# Patient Record
Sex: Male | Born: 1955 | Race: Black or African American | Hispanic: No | Marital: Married | Smoking: Current some day smoker
Health system: Southern US, Community
[De-identification: ages and names within clinical notes are randomized; demographics above are authoritative.]

## PROBLEM LIST (undated history)

## (undated) DIAGNOSIS — I10 Essential (primary) hypertension: Secondary | ICD-10-CM

## (undated) DIAGNOSIS — E119 Type 2 diabetes mellitus without complications: Secondary | ICD-10-CM

## (undated) HISTORY — PX: ILIAC ARTERY STENT: SHX1786

## (undated) HISTORY — PX: CHOLECYSTECTOMY: SHX55

## (undated) HISTORY — PX: CLOSED REDUCTION CONGENTIAL HIP DISLOCATION: SHX1357

## (undated) HISTORY — PX: CARDIAC CATHETERIZATION: SHX172

---

## 2010-12-09 DIAGNOSIS — I219 Acute myocardial infarction, unspecified: Secondary | ICD-10-CM

## 2010-12-09 HISTORY — DX: Acute myocardial infarction, unspecified: I21.9

## 2010-12-09 HISTORY — PX: CORONARY ARTERY BYPASS GRAFT: SHX141

## 2011-12-10 HISTORY — PX: CARDIAC CATHETERIZATION: SHX172

## 2020-04-05 ENCOUNTER — Other Ambulatory Visit: Payer: Self-pay | Admitting: Podiatry

## 2020-04-14 ENCOUNTER — Encounter (HOSPITAL_COMMUNITY): Payer: Self-pay

## 2020-04-17 ENCOUNTER — Other Ambulatory Visit (HOSPITAL_COMMUNITY)
Admission: RE | Admit: 2020-04-17 | Discharge: 2020-04-17 | Disposition: A | Discharge status: Home / Self Care | Payer: Medicare Other | Source: Ambulatory Visit | Attending: Podiatry | Admitting: Podiatry

## 2020-04-17 ENCOUNTER — Encounter (HOSPITAL_COMMUNITY)
Admission: RE | Admit: 2020-04-17 | Discharge: 2020-04-17 | Disposition: A | Discharge status: Home / Self Care | Payer: Medicare Other | Source: Ambulatory Visit | Attending: Podiatry | Admitting: Podiatry

## 2020-04-17 ENCOUNTER — Encounter (HOSPITAL_COMMUNITY): Payer: Self-pay

## 2020-04-17 ENCOUNTER — Other Ambulatory Visit: Payer: Self-pay

## 2020-04-17 ENCOUNTER — Ambulatory Visit (HOSPITAL_COMMUNITY)
Admission: RE | Admit: 2020-04-17 | Discharge: 2020-04-17 | Disposition: A | Discharge status: Home / Self Care | Payer: Medicare Other | Source: Ambulatory Visit | Attending: Podiatry | Admitting: Podiatry

## 2020-04-17 DIAGNOSIS — Z20822 Contact with and (suspected) exposure to covid-19: Secondary | ICD-10-CM | POA: Insufficient documentation

## 2020-04-17 DIAGNOSIS — Z01818 Encounter for other preprocedural examination: Secondary | ICD-10-CM | POA: Diagnosis present

## 2020-04-17 DIAGNOSIS — L97512 Non-pressure chronic ulcer of other part of right foot with fat layer exposed: Secondary | ICD-10-CM | POA: Insufficient documentation

## 2020-04-17 DIAGNOSIS — M19071 Primary osteoarthritis, right ankle and foot: Secondary | ICD-10-CM | POA: Diagnosis not present

## 2020-04-17 HISTORY — DX: Essential (primary) hypertension: I10

## 2020-04-17 HISTORY — DX: Type 2 diabetes mellitus without complications: E11.9

## 2020-04-17 LAB — HEMOGLOBIN A1C
Hgb A1c MFr Bld: 9.9 % — ABNORMAL HIGH (ref 4.8–5.6)
Mean Plasma Glucose: 237.43 mg/dL

## 2020-04-17 LAB — GLUCOSE, CAPILLARY: Glucose-Capillary: 254 mg/dL — ABNORMAL HIGH (ref 70–99)

## 2020-04-17 NOTE — Patient Instructions (Signed)
Your procedure is scheduled on: 04/19/2020  Report to [facility] at 7:30    AM.  Call this number if you have problems the morning of surgery: [phone]   Remember:   Do not Eat or Drink after midnight         No Smoking the morning of surgery  :  Take these medicines the morning of surgery with A SIP OF WATER: Amlodipine, Coreg, imdur, metoprolol, lyrica .   Use inhalers if needed               Take only 1/2 dose of levemir 12 units night before surgery                No diabetic medication am of surgery   Do not wear jewelry, make-up or nail polish.  Do not wear lotions, powders, or perfumes. You may wear deodorant.  Do not shave 48 hours prior to surgery. Men may shave face and neck.  Do not bring valuables to the hospital.  Contacts, dentures or bridgework may not be worn into surgery.  Leave suitcase in the car. After surgery it may be brought to your room.  For patients admitted to the hospital, checkout time is 11:00 AM the day of discharge.   Patients discharged the day of surgery will not be allowed to drive home.    Special Instructions: Shower using CHG night before surgery and shower the day of surgery use CHG.  Use special wash - you have one bottle of CHG for all showers.  You should use approximately 1/2 of the bottle for each shower.  Wound Care, Adult Taking care of your wound properly can help to prevent pain, infection, and scarring. It can also help your wound to heal more quickly. How to care for your wound Wound care      Follow instructions from your health care provider about how to take care of your wound. Make sure you: ? Wash your hands with soap and water before you change the bandage (dressing). If soap and water are not available, use hand sanitizer. ? Change your dressing as told by your health care provider. ? Leave stitches (sutures), skin glue, or adhesive strips in place. These skin closures may need to stay in place for 2 weeks or  longer. If adhesive strip edges start to loosen and curl up, you may trim the loose edges. Do not remove adhesive strips completely unless your health care provider tells you to do that.  Check your wound area every day for signs of infection. Check for: ? Redness, swelling, or pain. ? Fluid or blood. ? Warmth. ? Pus or a bad smell.  Ask your health care provider if you should clean the wound with mild soap and water. Doing this may include: ? Using a clean towel to pat the wound dry after cleaning it. Do not rub or scrub the wound. ? Applying a cream or ointment. Do this only as told by your health care provider. ? Covering the incision with a clean dressing.  Ask your health care provider when you can leave the wound uncovered.  Keep the dressing dry until your health care provider says it can be removed. Do not take baths, swim, use a hot tub, or do anything that would put the wound underwater until your health care provider approves. Ask your health care provider if you can take showers. You may only be allowed to take sponge baths. Medicines   If you  were prescribed an antibiotic medicine, cream, or ointment, take or use the antibiotic as told by your health care provider. Do not stop taking or using the antibiotic even if your condition improves.  Take over-the-counter and prescription medicines only as told by your health care provider. If you were prescribed pain medicine, take it 30 or more minutes before you do any wound care or as told by your health care provider. General instructions  Return to your normal activities as told by your health care provider. Ask your health care provider what activities are safe.  Do not scratch or pick at the wound.  Do not use any products that contain nicotine or tobacco, such as cigarettes and e-cigarettes. These may delay wound healing. If you need help quitting, ask your health care provider.  Keep all follow-up visits as told by your  health care provider. This is important.  Eat a diet that includes protein, vitamin A, vitamin C, and other nutrient-rich foods to help the wound heal. ? Foods rich in protein include meat, dairy, beans, nuts, and other sources. ? Foods rich in vitamin A include carrots and dark green, leafy vegetables. ? Foods rich in vitamin C include citrus, tomatoes, and other fruits and vegetables. ? Nutrient-rich foods have protein, carbohydrates, fat, vitamins, or minerals. Eat a variety of healthy foods including vegetables, fruits, and whole grains. Contact a health care provider if:  You received a tetanus shot and you have swelling, severe pain, redness, or bleeding at the injection site.  Your pain is not controlled with medicine.  You have redness, swelling, or pain around the wound.  You have fluid or blood coming from the wound.  Your wound feels warm to the touch.  You have pus or a bad smell coming from the wound.  You have a fever or chills.  You are nauseous or you vomit.  You are dizzy. Get help right away if:  You have a red streak going away from your wound.  The edges of the wound open up and separate.  Your wound is bleeding, and the bleeding does not stop with gentle pressure.  You have a rash.  You faint.  You have trouble breathing. Summary  Always wash your hands with soap and water before changing your bandage (dressing).  To help with healing, eat foods that are rich in protein, vitamin A, vitamin C, and other nutrients.  Check your wound every day for signs of infection. Contact your health care provider if you suspect that your wound is infected. This information is not intended to replace advice given to you by your health care provider. Make sure you discuss any questions you have with your health care provider. Document Revised: 03/15/2019 Document Reviewed: 06/11/2016 Elsevier Patient Education  2020 [employer]  Monitored Anesthesia Care, Care  After These instructions provide you with information about caring for yourself after your procedure. Your health care provider may also give you more specific instructions. Your treatment has been planned according to current medical practices, but problems sometimes occur. Call your health care provider if you have any problems or questions after your procedure. What can I expect after the procedure? After your procedure, you may:  Feel sleepy for several hours.  Feel clumsy and have poor balance for several hours.  Feel forgetful about what happened after the procedure.  Have poor judgment for several hours.  Feel nauseous or vomit.  Have a sore throat if you had a breathing tube during the  procedure. Follow these instructions at home: For at least 24 hours after the procedure:      Have a responsible adult stay with you. It is important to have someone help care for you until you are awake and alert.  Rest as needed.  Do not: ? Participate in activities in which you could fall or become injured. ? Drive. ? Use heavy machinery. ? Drink alcohol. ? Take sleeping pills or medicines that cause drowsiness. ? Make important decisions or sign legal documents. ? Take care of children on your own. Eating and drinking  Follow the diet that is recommended by your health care provider.  If you vomit, drink water, juice, or soup when you can drink without vomiting.  Make sure you have little or no nausea before eating solid foods. General instructions  Take over-the-counter and prescription medicines only as told by your health care provider.  If you have sleep apnea, surgery and certain medicines can increase your risk for breathing problems. Follow instructions from your health care provider about wearing your sleep device: ? Anytime you are sleeping, including during daytime naps. ? While taking prescription pain medicines, sleeping medicines, or medicines that make you  drowsy.  If you smoke, do not smoke without supervision.  Keep all follow-up visits as told by your health care provider. This is important. Contact a health care provider if:  You keep feeling nauseous or you keep vomiting.  You feel light-headed.  You develop a rash.  You have a fever. Get help right away if:  You have trouble breathing. Summary  For several hours after your procedure, you may feel sleepy and have poor judgment.  Have a responsible adult stay with you for at least 24 hours or until you are awake and alert. This information is not intended to replace advice given to you by your health care provider. Make sure you discuss any questions you have with your health care provider. Document Revised: 02/23/2018 Document Reviewed: 03/17/2016 Elsevier Patient Education  2020 [employer].

## 2020-04-18 LAB — SARS CORONAVIRUS 2 (TAT 6-24 HRS): SARS Coronavirus 2: NEGATIVE

## 2020-04-19 ENCOUNTER — Encounter (HOSPITAL_COMMUNITY)
Admission: RE | Disposition: A | Discharge status: Home / Self Care | Payer: Self-pay | Source: Home / Self Care | Attending: Podiatry

## 2020-04-19 ENCOUNTER — Ambulatory Visit (HOSPITAL_COMMUNITY): Payer: Medicare Other | Admitting: Anesthesiology

## 2020-04-19 ENCOUNTER — Ambulatory Visit (HOSPITAL_COMMUNITY)
Admission: RE | Admit: 2020-04-19 | Discharge: 2020-04-19 | Disposition: A | Discharge status: Home / Self Care | Payer: Medicare Other | Attending: Podiatry | Admitting: Podiatry

## 2020-04-19 ENCOUNTER — Ambulatory Visit (HOSPITAL_COMMUNITY): Payer: Medicare Other

## 2020-04-19 DIAGNOSIS — Z951 Presence of aortocoronary bypass graft: Secondary | ICD-10-CM | POA: Insufficient documentation

## 2020-04-19 DIAGNOSIS — Z79899 Other long term (current) drug therapy: Secondary | ICD-10-CM | POA: Diagnosis not present

## 2020-04-19 DIAGNOSIS — E1142 Type 2 diabetes mellitus with diabetic polyneuropathy: Secondary | ICD-10-CM | POA: Diagnosis not present

## 2020-04-19 DIAGNOSIS — L97512 Non-pressure chronic ulcer of other part of right foot with fat layer exposed: Secondary | ICD-10-CM | POA: Insufficient documentation

## 2020-04-19 DIAGNOSIS — E11621 Type 2 diabetes mellitus with foot ulcer: Secondary | ICD-10-CM | POA: Insufficient documentation

## 2020-04-19 DIAGNOSIS — Z7902 Long term (current) use of antithrombotics/antiplatelets: Secondary | ICD-10-CM | POA: Insufficient documentation

## 2020-04-19 DIAGNOSIS — E1151 Type 2 diabetes mellitus with diabetic peripheral angiopathy without gangrene: Secondary | ICD-10-CM | POA: Insufficient documentation

## 2020-04-19 DIAGNOSIS — Z7982 Long term (current) use of aspirin: Secondary | ICD-10-CM | POA: Insufficient documentation

## 2020-04-19 DIAGNOSIS — Z87891 Personal history of nicotine dependence: Secondary | ICD-10-CM | POA: Diagnosis not present

## 2020-04-19 DIAGNOSIS — Z794 Long term (current) use of insulin: Secondary | ICD-10-CM | POA: Insufficient documentation

## 2020-04-19 DIAGNOSIS — E1165 Type 2 diabetes mellitus with hyperglycemia: Secondary | ICD-10-CM | POA: Diagnosis not present

## 2020-04-19 DIAGNOSIS — M2011 Hallux valgus (acquired), right foot: Secondary | ICD-10-CM | POA: Insufficient documentation

## 2020-04-19 DIAGNOSIS — M89371 Hypertrophy of bone, right ankle and foot: Secondary | ICD-10-CM | POA: Diagnosis not present

## 2020-04-19 DIAGNOSIS — I10 Essential (primary) hypertension: Secondary | ICD-10-CM | POA: Diagnosis not present

## 2020-04-19 DIAGNOSIS — Z95820 Peripheral vascular angioplasty status with implants and grafts: Secondary | ICD-10-CM | POA: Diagnosis not present

## 2020-04-19 DIAGNOSIS — Z955 Presence of coronary angioplasty implant and graft: Secondary | ICD-10-CM | POA: Diagnosis not present

## 2020-04-19 DIAGNOSIS — Z9889 Other specified postprocedural states: Secondary | ICD-10-CM

## 2020-04-19 HISTORY — PX: TIBIALIS TENDON TRANSFER / REPAIR: SHX6630

## 2020-04-19 HISTORY — PX: ACHILLES TENDON SURGERY: SHX542

## 2020-04-19 LAB — GLUCOSE, CAPILLARY: Glucose-Capillary: 162 mg/dL — ABNORMAL HIGH (ref 70–99)

## 2020-04-19 SURGERY — TRANSFER, TENDON, TIBIA
Anesthesia: General | Site: Foot | Laterality: Right

## 2020-04-19 MED ORDER — FENTANYL CITRATE (PF) 100 MCG/2ML IJ SOLN
INTRAMUSCULAR | Status: DC | PRN
  Administered 2020-04-19: 50 ug via INTRAVENOUS
  Administered 2020-04-19 (×2): 25 ug via INTRAVENOUS

## 2020-04-19 MED ORDER — GLYCOPYRROLATE 0.2 MG/ML IJ SOLN
INTRAMUSCULAR | Status: DC | PRN
  Administered 2020-04-19: .2 mg via INTRAVENOUS

## 2020-04-19 MED ORDER — MEPERIDINE HCL 50 MG/ML IJ SOLN: 6.2500 mg | INTRAMUSCULAR | Status: DC | PRN

## 2020-04-19 MED ORDER — BUPIVACAINE HCL (PF) 0.5 % IJ SOLN
INTRAMUSCULAR | Status: DC | PRN
  Administered 2020-04-19: 20 mL

## 2020-04-19 MED ORDER — LACTATED RINGERS IV SOLN: Freq: Once | INTRAVENOUS | Status: AC

## 2020-04-19 MED ORDER — LIDOCAINE HCL (CARDIAC) PF 50 MG/5ML IV SOSY
PREFILLED_SYRINGE | INTRAVENOUS | Status: DC | PRN
  Administered 2020-04-19: 60 mg via INTRAVENOUS

## 2020-04-19 MED ORDER — PROMETHAZINE HCL 25 MG/ML IJ SOLN: 6.2500 mg | INTRAMUSCULAR | Status: DC | PRN

## 2020-04-19 MED ORDER — FENTANYL CITRATE (PF) 100 MCG/2ML IJ SOLN
INTRAMUSCULAR | Status: AC
  Filled 2020-04-19: qty 2

## 2020-04-19 MED ORDER — ONDANSETRON HCL 4 MG/2ML IJ SOLN
INTRAMUSCULAR | Status: DC | PRN
  Administered 2020-04-19: 4 mg via INTRAVENOUS

## 2020-04-19 MED ORDER — MIDAZOLAM HCL 5 MG/5ML IJ SOLN
INTRAMUSCULAR | Status: DC | PRN
  Administered 2020-04-19: 2 mg via INTRAVENOUS

## 2020-04-19 MED ORDER — PROPOFOL 10 MG/ML IV BOLUS
INTRAVENOUS | Status: DC | PRN
  Administered 2020-04-19: 160 mg via INTRAVENOUS

## 2020-04-19 MED ORDER — LIDOCAINE 2% (20 MG/ML) 5 ML SYRINGE
INTRAMUSCULAR | Status: AC
  Filled 2020-04-19: qty 10

## 2020-04-19 MED ORDER — MIDAZOLAM HCL 2 MG/2ML IJ SOLN
INTRAMUSCULAR | Status: AC
  Filled 2020-04-19: qty 2

## 2020-04-19 MED ORDER — GLYCOPYRROLATE PF 0.2 MG/ML IJ SOSY
PREFILLED_SYRINGE | INTRAMUSCULAR | Status: AC
  Filled 2020-04-19: qty 1

## 2020-04-19 MED ORDER — CHLORHEXIDINE GLUCONATE CLOTH 2 % EX PADS: 6.0000 | MEDICATED_PAD | Freq: Once | CUTANEOUS | Status: DC

## 2020-04-19 MED ORDER — 0.9 % SODIUM CHLORIDE (POUR BTL) OPTIME
TOPICAL | Status: DC | PRN
  Administered 2020-04-19: 1000 mL

## 2020-04-19 MED ORDER — CEFAZOLIN SODIUM-DEXTROSE 2-4 GM/100ML-% IV SOLN
2.0000 g | INTRAVENOUS | Status: AC
  Administered 2020-04-19: 2 g via INTRAVENOUS
  Filled 2020-04-19: qty 100

## 2020-04-19 MED ORDER — BUPIVACAINE HCL (PF) 0.5 % IJ SOLN
INTRAMUSCULAR | Status: AC
  Filled 2020-04-19: qty 30

## 2020-04-19 MED ORDER — EPHEDRINE 5 MG/ML INJ
INTRAVENOUS | Status: AC
  Filled 2020-04-19: qty 20

## 2020-04-19 MED ORDER — EPHEDRINE SULFATE 50 MG/ML IJ SOLN
INTRAMUSCULAR | Status: DC | PRN
Start: 2020-04-19 — End: 2020-04-19
  Administered 2020-04-19 (×2): 10 mg via INTRAVENOUS

## 2020-04-19 MED ORDER — HYDROMORPHONE HCL 1 MG/ML IJ SOLN: 0.2500 mg | INTRAMUSCULAR | Status: DC | PRN

## 2020-04-19 MED ORDER — LIDOCAINE HCL (PF) 1 % IJ SOLN
INTRAMUSCULAR | Status: AC
  Filled 2020-04-19: qty 30

## 2020-04-19 MED ORDER — PROPOFOL 10 MG/ML IV BOLUS
INTRAVENOUS | Status: AC
  Filled 2020-04-19: qty 20

## 2020-04-19 SURGICAL SUPPLY — 62 items
0.062 k wire ×3 IMPLANT
BANDAGE ELASTIC 4 VELCRO NS (GAUZE/BANDAGES/DRESSINGS) ×6 IMPLANT
BANDAGE ELASTIC 6 VELCRO NS (GAUZE/BANDAGES/DRESSINGS) ×3 IMPLANT
BANDAGE ESMARK 4X12 BL STRL LF (DISPOSABLE) ×1 IMPLANT
BENZOIN TINCTURE PRP APPL 2/3 (GAUZE/BANDAGES/DRESSINGS) ×3 IMPLANT
BIT DRILL 2.0 HCS 150 (BIT) ×3 IMPLANT
BIT DRILL MICR ACTRK 2 LNG PRF (BIT) ×1 IMPLANT
BLADE AVERAGE 25MMX9MM (BLADE) ×1
BLADE AVERAGE 25X9 (BLADE) ×2 IMPLANT
BLADE SURG 15 STRL LF DISP TIS (BLADE) ×2 IMPLANT
BLADE SURG 15 STRL SS (BLADE) ×4
BLADE SURG SZ11 CARB STEEL (BLADE) ×3 IMPLANT
BNDG CONFORM 3 STRL LF (GAUZE/BANDAGES/DRESSINGS) ×3 IMPLANT
BNDG ELASTIC 4X5.8 VLCR NS LF (GAUZE/BANDAGES/DRESSINGS) ×3 IMPLANT
BNDG ESMARK 4X12 BLUE STRL LF (DISPOSABLE) ×3
BNDG GAUZE ELAST 4 BULKY (GAUZE/BANDAGES/DRESSINGS) ×3 IMPLANT
BOOT STEPPER DURA XLG (SOFTGOODS) ×3 IMPLANT
CAP PIN ORTHO PINK (CAP) ×3 IMPLANT
CHLORAPREP W/TINT 26 (MISCELLANEOUS) ×3 IMPLANT
CLOTH BEACON ORANGE TIMEOUT ST (SAFETY) ×3 IMPLANT
COVER LIGHT HANDLE STERIS (MISCELLANEOUS) ×6 IMPLANT
COVER WAND RF STERILE (DRAPES) ×3 IMPLANT
CUFF TOURN SGL QUICK 34 (TOURNIQUET CUFF) ×2
CUFF TRNQT CYL 34X4.125X (TOURNIQUET CUFF) ×1 IMPLANT
DRAPE OEC MINIVIEW 54X84 (DRAPES) ×3 IMPLANT
DRILL MICRO ACUTRAK 2 LNG PROF (BIT) ×3
DRSG ADAPTIC 3X8 NADH LF (GAUZE/BANDAGES/DRESSINGS) ×3 IMPLANT
ELECT REM PT RETURN 9FT ADLT (ELECTROSURGICAL) ×3
ELECTRODE REM PT RTRN 9FT ADLT (ELECTROSURGICAL) ×1 IMPLANT
GAUZE SPONGE 4X4 12PLY STRL (GAUZE/BANDAGES/DRESSINGS) ×3 IMPLANT
GLOVE BIO SURGEON STRL SZ7.5 (GLOVE) ×3 IMPLANT
GLOVE BIOGEL PI IND STRL 7.0 (GLOVE) ×2 IMPLANT
GLOVE BIOGEL PI IND STRL 7.5 (GLOVE) ×1 IMPLANT
GLOVE BIOGEL PI INDICATOR 7.0 (GLOVE) ×4
GLOVE BIOGEL PI INDICATOR 7.5 (GLOVE) ×2
GLOVE ECLIPSE 6.5 STRL STRAW (GLOVE) ×3 IMPLANT
GLOVE ECLIPSE 7.0 STRL STRAW (GLOVE) ×3 IMPLANT
GOWN STRL REUS W/ TWL LRG LVL3 (GOWN DISPOSABLE) ×1 IMPLANT
GOWN STRL REUS W/TWL LRG LVL3 (GOWN DISPOSABLE) ×8 IMPLANT
GUIDEWIRE ORTHO MICROSHT  ACUT (WIRE) ×4
GUIDEWIRE ORTHO MICROSHT .035 (WIRE) ×2 IMPLANT
K-WIRE DBL END TROCAR 6X.062 (WIRE) ×6
K-WIRE NON THREAD 1.1 (WIRE) ×3
KIT TURNOVER KIT A (KITS) ×3 IMPLANT
KWIRE DBL END TROCAR 6X.062 (WIRE) ×2 IMPLANT
KWIRE NON THREAD 1.1 (WIRE) ×1 IMPLANT
MANIFOLD NEPTUNE II (INSTRUMENTS) ×3 IMPLANT
NEEDLE HYPO 27GX1-1/4 (NEEDLE) ×3 IMPLANT
NS IRRIG 1000ML POUR BTL (IV SOLUTION) ×3 IMPLANT
PACK BASIC LIMB (CUSTOM PROCEDURE TRAY) ×3 IMPLANT
PAD ARMBOARD 7.5X6 YLW CONV (MISCELLANEOUS) ×3 IMPLANT
RASP SM TEAR CROSS CUT (RASP) ×3 IMPLANT
SCREW ACUTRAK 2 MICRO 18MM (Screw) ×3 IMPLANT
SCREW ACUTRAK 2 MINI 18MM (Screw) ×3 IMPLANT
SET BASIN LINEN APH (SET/KITS/TRAYS/PACK) ×3 IMPLANT
SPLINT FIBERGLASS 4X30 (CAST SUPPLIES) ×3 IMPLANT
SPONGE LAP 18X18 RF (DISPOSABLE) ×3 IMPLANT
SUT ETHILON 4 0 P 3 18 (SUTURE) ×6 IMPLANT
SUT MON AB 5-0 PS2 18 (SUTURE) ×3 IMPLANT
SUT VIC AB 4-0 PS2 27 (SUTURE) ×3 IMPLANT
SYR CONTROL 10ML LL (SYRINGE) ×6 IMPLANT
WATER STERILE IRR 1000ML POUR (IV SOLUTION) ×3 IMPLANT

## 2020-04-19 NOTE — H&P (Signed)
HISTORY AND PHYSICAL INTERVAL NOTE:  04/19/2020  8:39 AM  [facility]  has presented today for surgery, with the diagnosis of chronic ulcer of the right foot and hallux valgus of the right foot.  The various methods of treatment have been discussed with the patient.  No guarantees were given.  After consideration of risks, benefits and other options for treatment, the patient has consented to surgery.  I have reviewed the patients' chart and labs.    Patient Vitals for the past 24 hrs:  BP Temp Pulse Resp SpO2  04/19/20 0813 (!) 175/86 98.3 F (36.8 C) (!) 50 18 98 %    A history and physical examination was performed in my office.  The patient was reexamined.  There have been no changes to this history and physical examination.  Dallas Schimke, DPM

## 2020-04-19 NOTE — Brief Op Note (Addendum)
BRIEF OPERATIVE NOTE  DATE OF PROCEDURE 04/19/2020  SURGEON Dallas Schimke, DPM  ASSISTANT SURGEON Delton See, DPM  OR STAFF Circulator: Cox, Wyatt Haste, RN Scrub Person: Jonell Cluck, CST   PREOPERATIVE DIAGNOSIS 1.  Chronic ulcer, right foot 2.  Hallux valgus, right foot 3.  Diabetes mellitus with peripheral neuropathy  POSTOPERATIVE DIAGNOSIS 1.  Chronic ulcer, right foot 2.  Hypertrophied tibial sesamoid, right foot 3.  Hallux valgus, right foot 4.  Diabetes mellitus with peripheral neuropathy  PROCEDURE 1.  Tibial sesamoidectomy, right foot 2.  Chevron osteotomy of the distal first metatarsal, right foot 3.  Debridement of ulceration, right foot  4.  Percutaneous Achilles tendon lengthening, right foot 5.  Application of below knee splint, right lower extremity  ANESTHESIA General   HEMOSTASIS Pneumatic thigh tourniquet set at 300 mmHg  ESTIMATED BLOOD LOSS Minimal (<5 cc)  MATERIALS USED 0.062" K-wire  INJECTABLES 0.5% Marcaine plain  PATHOLOGY Tibial sesamoid, right foot  COMPLICATIONS None

## 2020-04-19 NOTE — Op Note (Signed)
OPERATIVE NOTE  DATE OF PROCEDURE 04/19/2020  SURGEON Marcheta Grammes, DPM  ASSISTANT SURGEON Jilda Panda, DPM  OR STAFF Circulator: Cox, Gershon Mussel, RN Scrub Person: Lucie Leather, CST   PREOPERATIVE DIAGNOSIS 1.  Chronic ulcer, right foot 2.  Hallux valgus, right foot 3.  Diabetes mellitus with peripheral neuropathy  POSTOPERATIVE DIAGNOSIS 1.  Chronic ulcer, right foot 2.  Hypertrophied tibial sesamoid, right foot 3.  Hallux valgus, right foot 4.  Diabetes mellitus with peripheral neuropathy  PROCEDURE 1.  Tibial sesamoidectomy, right foot 2.  Chevron osteotomy of the distal first metatarsal, right foot 3.  Debridement of ulceration, right foot  4.  Percutaneous Achilles tendon lengthening, right foot 5.  Application of below knee splint, right lower extremity  ANESTHESIA General   HEMOSTASIS Pneumatic thigh tourniquet set at 300 mmHg  ESTIMATED BLOOD LOSS Minimal (<5 cc)  MATERIALS USED 0.062" K-wire  INJECTABLES 0.5% Marcaine plain  PATHOLOGY Tibial sesamoid, right foot  COMPLICATIONS None  INDICATIONS: The patient has had a chronic ulceration of his right foot that failed to heal with local wound care.  A noninvasive arterial study was performed which revealed perfusion adequate for healing.  Surgery was recommended.  The planned procedures were explained.  All questions were answered.  No guarantees were given.  An informed consent was obtained.  DESCRIPTION OF THE PROCEDURE: The patient was brought to the operating room and placed on the operative table in the supine position.  After general anesthesia, a pneumatic thigh tourniquet was placed about the patient's right thigh.  The right lower extremity was scrubbed, prepped and draped in the usual sterile fashion.  A timeout was performed.  The right lower extremity was then elevated, exsanguinated and the pneumatic thigh tourniquet was inflated to 300 mmHg.  The right foot was anesthetized with  0.5% Marcaine plain.  Attention was directed to the medial aspect of the first MPJ.  A linear longitudinal incision was made medial to the first MPJ.  Dissection was continued deep down to the level of the first MPJ.  All vital structures were identified and retracted.  A linear longitudinal periosteal and capsular incision was performed.  The periosteal and capsular structures were reflected dorsally and plantarly.  The tibial sesamoid was identified and freed of all soft tissue attachments.  The tibial sesamoid was removed and sent to pathology for evaluation.  The flexor hallucis longus tendon was identified and noted to be intact.  A chevron osteotomy was performed in the distal first metatarsal using a power bone saw.  The capital fragment was distracted and shifted into a more corrected lateral position.  A cyst was encountered while performing the osteotomy.  An Acumed AccuTrack micro screw was then inserted across the osteotomy.  Adequate compression was not achieved.  The screw was removed.  A larger screw from the Acumed AccuTrack set was inserted across the osteotomy.  Adequate compression was not achieved.  The screw was removed.  At 0.062 inch Kirschner wire was inserted across the osteotomy from a proximal medial to distal lateral direction.  Adequate fixation was achieved.  The Kirschner wire was bent and cut.  The residual medial eminence was resected using a power bone saw.  The wound was irrigated with copious amounts of sterile irrigant.  The capsular structures were reapproximated using 2-0 Vicryl.  The subcutaneous structures were reapproximated using 3-0 Vicryl.  The skin was reapproximated using 4-0 Vicryl in a running subcuticular manner.  The wound closure was reinforced  with 4-0 nlon.  Attention was directed to the ulceration along the medial plantar aspect of the right foot.  The ulceration measured 0.8 x 0.4 x 0.2 cm.  The wound bed was comprised of mixed fibrotic and granular tissue.   The ulceration required debridement of the adherent, nonviable fibrotic tissue.  A sharp excisional debridement of the ulceration was performed using a #15 blade and soft tissue nipper.  The wound bed was comprised of next including subcutaneous tissue was debrided and removed.  Post debridement, the ulceration measured 1.1 x 0.6 x 0.3 cm.  Attention was directed to the posterior aspect of the right Achilles tendon.  A percutaneous lengthening of the Achilles tendon was performed.  3 hemisections were created using a #15 blade to were created medially and one laterally.  The limted dorsiflexion improved.  The wounds were irrigated with saline.  The incisions were reapproximated using 4-0 nylon.  A sterile compressive dressing was applied to the right lower extremity.  The pneumatic thigh tourniquet was deflated and a prompt hyperemic response was noted to all digits of the right foot.  A below-knee splint was applied to the left lower extremity.  The patient tolerated the procedurea and anesthesia well.  He was transferred from the operating room to the postanesthesia care unit with vital signs stable.

## 2020-04-19 NOTE — Transfer of Care (Signed)
Immediate Anesthesia Transfer of Care Note  Patient: [facility]  Procedure(s) Performed: TIBIALIS SESMOIDECTOMY;  CHEVRON OSTEOTOMY (Right Foot) PERCUTANEOUS TENDON ACHILLES LENGTHENING (Right Foot)  Patient Location: PACU  Anesthesia Type:General  Level of Consciousness: awake  Airway & Oxygen Therapy: Patient Spontanous Breathing  Post-op Assessment: Report given to RN  Post vital signs: Reviewed  Last Vitals:  Vitals Value Taken Time  BP 167/92 04/19/20 1138  Temp    Pulse 57 04/19/20 1140  Resp 16 04/19/20 1140  SpO2 92 % 04/19/20 1140  Vitals shown include unvalidated device data.  Last Pain:  Vitals:   04/19/20 0813  PainSc: 0-No pain         Complications: No apparent anesthesia complications

## 2020-04-19 NOTE — Anesthesia Procedure Notes (Signed)
Procedure Name: LMA Insertion Date/Time: 04/19/2020 9:08 AM Performed by: Moshe Salisbury, CRNA Pre-anesthesia Checklist: Patient identified, Patient being monitored, Emergency Drugs available, Timeout performed and Suction available Patient Re-evaluated:Patient Re-evaluated prior to induction Oxygen Delivery Method: Circle System Utilized Preoxygenation: Pre-oxygenation with 100% oxygen Induction Type: IV induction Ventilation: Mask ventilation without difficulty LMA: LMA inserted LMA Size: 5.0 Number of attempts: 1 Placement Confirmation: positive ETCO2 and breath sounds checked- equal and bilateral

## 2020-04-19 NOTE — Anesthesia Preprocedure Evaluation (Addendum)
Anesthesia Evaluation  Patient identified by MRN, date of birth, ID band Patient awake    Reviewed: Allergy & Precautions, NPO status , Patient's Chart, lab work & pertinent test results, reviewed documented beta blocker date and time   Airway Mallampati: II  TM Distance: >3 FB Neck ROM: Full    Dental  (+) Dental Advisory Given, Missing   Pulmonary Current Smoker and Patient abstained from smoking.,    Pulmonary exam normal breath sounds clear to auscultation       Cardiovascular Exercise Tolerance: Good hypertension, Pt. on medications and Pt. on home beta blockers + CABG and + Peripheral Vascular Disease  Normal cardiovascular exam Rhythm:Regular Rate:Normal  Stress test - negative   Neuro/Psych negative neurological ROS  negative psych ROS   GI/Hepatic negative GI ROS, Neg liver ROS,   Endo/Other  diabetes, Poorly Controlled, Type 2, Insulin Dependent, Oral Hypoglycemic Agents  Renal/GU      Musculoskeletal negative musculoskeletal ROS (+)   Abdominal   Peds  Hematology negative hematology ROS (+)   Anesthesia Other Findings   Reproductive/Obstetrics negative OB ROS                           Anesthesia Physical Anesthesia Plan  ASA: III  Anesthesia Plan: General   Post-op Pain Management:    Induction: Intravenous  PONV Risk Score and Plan: 2 and Ondansetron and Midazolam  Airway Management Planned: LMA  Additional Equipment:   Intra-op Plan:   Post-operative Plan:   Informed Consent: I have reviewed the patients History and Physical, chart, labs and discussed the procedure including the risks, benefits and alternatives for the proposed anesthesia with the patient or authorized representative who has indicated his/her understanding and acceptance.     Dental advisory given  Plan Discussed with: CRNA and Surgeon  Anesthesia Plan Comments:       Anesthesia Quick  Evaluation

## 2020-04-19 NOTE — Anesthesia Postprocedure Evaluation (Signed)
Anesthesia Post Note  Patient: [facility]  Procedure(s) Performed: TIBIALIS SESMOIDECTOMY;  CHEVRON OSTEOTOMY (Right Foot) PERCUTANEOUS TENDON ACHILLES LENGTHENING (Right Foot)  Patient location during evaluation: PACU Anesthesia Type: General Level of consciousness: awake, awake and alert and oriented Pain management: pain level controlled Vital Signs Assessment: post-procedure vital signs reviewed and stable Respiratory status: spontaneous breathing and respiratory function stable Cardiovascular status: blood pressure returned to baseline and stable Postop Assessment: no apparent nausea or vomiting Anesthetic complications: no     Last Vitals:  Vitals:   04/19/20 1200 04/19/20 1217  BP: (!) 166/89 131/84  Pulse: (!) 56   Resp: 13 16  Temp:  (!) 36.4 C  SpO2: 99% 97%    Last Pain:  Vitals:   04/19/20 1217  PainSc: 8                  Kenyette Gundy C Hermine Feria

## 2020-04-19 NOTE — Discharge Instructions (Signed)
These instructions will give you an idea of what to expect after surgery and how to manage issues that may arise before your first post op office visit.  Pain Management Pain is best managed by "staying ahead" of it. If pain gets out of control, it is difficult to get it back under control. Local anesthesia that lasts 6-8 hours is used to numb the foot and decrease pain.  For the best pain control, take the pain medication every 4 hours for the first 2 days post op. On the third day pain medication can be taken as needed.   Post Op Nausea Nausea is common after surgery, so it is managed proactively.  If prescribed, use the prescribed nausea medication regularly for the first 2 days post op.  Bandages Do not worry if there is blood on the bandage. What looks like a lot of blood on the bandage is actually a small amount. Blood on the dressing spreads out as it is absorbed by the gauze, the same way a drop of water spreads out on a paper towel.  If the bandages feel wet or dry, stiff and uncomfortable, call the office during office hours and we will schedule a time for you to have the bandage changed.  Unless you are specifically told otherwise, we will do the first bandage change in the office.  Keep your bandage dry. If the bandage becomes wet or soiled, notify the office and we will schedule a time to change the bandage.  Activity It is best to spend most of the first 2 days after surgery lying down with the foot elevated above the level of your heart. You may put partial weight on your heel while wearing the splint to transfer on and off the toilet.    Driving Do not drive until you are able to respond in an emergency (i.e. slam on the brakes). This usually occurs after the bone has healed - 6 to 8 weeks.  Call the Office If you have a fever over 101F.  If you have increasing pain after the initial post op pain has settled down.  If you have increasing redness, swelling, or drainage.  If  you have any questions or concerns.

## 2020-04-21 LAB — SURGICAL PATHOLOGY

## 2020-06-19 ENCOUNTER — Other Ambulatory Visit: Payer: Self-pay | Admitting: Podiatry

## 2020-06-19 NOTE — Patient Instructions (Signed)
Richard Stout  06/19/2020     @   Your procedure is scheduled on   06/22/2020   Report to [facility] at  0730  A.M.  Call this number if you have problems the morning of surgery:  [phone]   Remember:  Do not eat or drink after midnight.                        Take these medicines the morning of surgery with A SIP OF WATER  Amlodipine, brilinta, carvedilol, lexapro, hydrocodone(if needed), isosorbide, metoprolol, lyrica, Phenergan(if needed). Take 1/2of your usual  levemir dosage the night before your procedure. DO NOT take any medications for diabetes the morning of your procedure.    Do not wear jewelry, make-up or nail polish.  Do not wear lotions, powders, or perfumes. Please wear deodorant and brush your teeth.  Do not shave 48 hours prior to surgery.  Men may shave face and neck.  Do not bring valuables to the hospital.  [facility] is not responsible for any belongings or valuables.  Contacts, dentures or bridgework may not be worn into surgery.  Leave your suitcase in the car.  After surgery it may be brought to your room.  For patients admitted to the hospital, discharge time will be determined by your treatment team.  Patients discharged the day of surgery will not be allowed to drive home.   Name and phone number of your driver:   family Special instructions:  DO NOT smoke the morning of your procedure.  Please read over the following fact sheets that you were given. Anesthesia Post-op Instructions and Care and Recovery After Surgery       Orthopedic Hardware Removal, Care After This sheet gives you information about how to care for yourself after your procedure. Your health care provider may also give you more specific instructions. If you have problems or questions, contact your health care provider. What can I expect after the procedure? After the procedure, it is common to have:  Soreness or pain.  Some swelling in the  area where the hardware was removed.  A small amount of blood or clear fluid coming from your incision. Follow these instructions at home: If you have a cast:  Do not stick anything inside the cast to scratch your skin. Doing that increases your risk of infection.  Check the skin around the cast every day. Tell your health care provider about any concerns.  You may put lotion on dry skin around the edges of the cast. Do not put lotion on the skin underneath the cast.  Keep the cast clean and dry. If you have a splint or boot:  Wear the splint or boot as told by your health care provider. Remove it only as told by your health care provider.  Loosen the splint or boot if your fingers or toes tingle, become numb, or turn cold and blue.  Keep the splint or boot clean and dry. Bathing  Do not take baths, swim, or use a hot tub until your health care provider approves. Ask your health care provider if you may take showers. You may only be allowed to take sponge baths.  Keep the bandage (dressing) dry until your health care provider says it can be removed.  If your cast, splint, or boot is not waterproof: ? Do not let it get wet. ? Cover it with a watertight covering  when you take a bath or a shower. Incision care   Follow instructions from your health care provider about how to take care of your incision. Make sure you: ? Wash your hands with soap and water before you change your dressing. If soap and water are not available, use hand sanitizer. ? Change your dressing as told by your health care provider. ? Leave stitches (sutures), skin glue, or adhesive strips in place. These skin closures may need to stay in place for 2 weeks or longer. If adhesive strip edges start to loosen and curl up, you may trim the loose edges. Do not remove adhesive strips completely unless your health care provider tells you to do that.  Check your incision area every day for signs of infection. Check  for: ? Redness. ? More swelling or pain. ? More fluid or blood. ? Warmth. ? Pus or a bad smell. Managing pain, stiffness, and swelling   If directed, put ice on the affected area: ? If you have a removable splint or boot, remove it as told by your health care provider. ? Put ice in a plastic bag. ? Place a towel between your skin and the bag. ? Leave the ice on for 20 minutes, 2-3 times a day.  Move your fingers or toes often to avoid stiffness and to lessen swelling.  Raise (elevate) the injured area above the level of your heart while you are sitting or lying down. Driving  Do not drive or use heavy machinery while taking prescription pain medicine.  Do not drive for 24 hours if you were given a medicine to help you relax (sedative) during your procedure.  Ask your health care provider when it is safe to drive if you have a cast, splint, or boot on the affected limb. Activity  Ask your health care provider what activities are safe for you during recovery, and ask what activities you need to avoid.  Do not use the injured limb to support your body weight until your health care provider says that you can.  Do not play contact sports until your health care provider approves.  Do exercises as told by your health care provider.  Avoid sitting for a long time without moving. Get up and move around at least every few hours. This will help prevent blood clots. General instructions  Do not put pressure on any part of the cast or splint until it is fully hardened. This may take several hours.  If you are taking prescription pain medicine, take actions to prevent or treat constipation. Your health care provider may recommend that you: ? Drink enough fluid to keep your urine pale yellow. ? Eat foods that are high in fiber, such as fresh fruits and vegetables, whole grains, and beans. ? Limit foods that are high in fat and processed sugars, such as fried or sweet foods. ? Take an  over-the-counter or prescription medicine for constipation.  Do not use any products that contain nicotine or tobacco, such as cigarettes and e-cigarettes. These can delay bone healing after surgery. If you need help quitting, ask your health care provider.  Take over-the-counter and prescription medicines only as told by your health care provider.  Keep all follow-up visits as told by your health care provider. This is important. Contact a health care provider if:  You have lasting pain.  You have redness around your incision.  You have more swelling or pain around your incision.  You have more fluid or blood  coming from your incision.  Your incision feels warm to the touch.  You have pus or a bad smell coming from your incision.  You are unable to do exercises or physical activity as told by your health care provider. Get help right away if:  You have difficulty breathing.  You have chest pain.  You have severe pain.  You have a fever or chills.  You have numbness for more than 24 hours in the area where the hardware was removed. Summary  After the procedure, it is common to have some pain and swelling in the area where the hardware was removed.  Follow instructions from your health care provider about how to take care of your incision.  Return to your normal activities as told by your health care provider. Ask your health care provider what activities are safe for you. This information is not intended to replace advice given to you by your health care provider. Make sure you discuss any questions you have with your health care provider. Document Revised: 01/21/2019 Document Reviewed: 12/18/2017 Elsevier Patient Education  2020 [employer]  Monitored Anesthesia Care, Care After These instructions provide you with information about caring for yourself after your procedure. Your health care provider may also give you more specific instructions. Your treatment has been  planned according to current medical practices, but problems sometimes occur. Call your health care provider if you have any problems or questions after your procedure. What can I expect after the procedure? After your procedure, you may:  Feel sleepy for several hours.  Feel clumsy and have poor balance for several hours.  Feel forgetful about what happened after the procedure.  Have poor judgment for several hours.  Feel nauseous or vomit.  Have a sore throat if you had a breathing tube during the procedure. Follow these instructions at home: For at least 24 hours after the procedure:      Have a responsible adult stay with you. It is important to have someone help care for you until you are awake and alert.  Rest as needed.  Do not: ? Participate in activities in which you could fall or become injured. ? Drive. ? Use heavy machinery. ? Drink alcohol. ? Take sleeping pills or medicines that cause drowsiness. ? Make important decisions or sign legal documents. ? Take care of children on your own. Eating and drinking  Follow the diet that is recommended by your health care provider.  If you vomit, drink water, juice, or soup when you can drink without vomiting.  Make sure you have little or no nausea before eating solid foods. General instructions  Take over-the-counter and prescription medicines only as told by your health care provider.  If you have sleep apnea, surgery and certain medicines can increase your risk for breathing problems. Follow instructions from your health care provider about wearing your sleep device: ? Anytime you are sleeping, including during daytime naps. ? While taking prescription pain medicines, sleeping medicines, or medicines that make you drowsy.  If you smoke, do not smoke without supervision.  Keep all follow-up visits as told by your health care provider. This is important. Contact a health care provider if:  You keep feeling  nauseous or you keep vomiting.  You feel light-headed.  You develop a rash.  You have a fever. Get help right away if:  You have trouble breathing. Summary  For several hours after your procedure, you may feel sleepy and have poor judgment.  Have a  responsible adult stay with you for at least 24 hours or until you are awake and alert. This information is not intended to replace advice given to you by your health care provider. Make sure you discuss any questions you have with your health care provider. Document Revised: 02/23/2018 Document Reviewed: 03/17/2016 Elsevier Patient Education  2020 [employer]. How to Use Chlorhexidine for Bathing Chlorhexidine gluconate (CHG) is a germ-killing (antiseptic) solution that is used to clean the skin. It can get rid of the bacteria that normally live on the skin and can keep them away for about 24 hours. To clean your skin with CHG, you may be given:  A CHG solution to use in the shower or as part of a sponge bath.  A prepackaged cloth that contains CHG. Cleaning your skin with CHG may help lower the risk for infection:  While you are staying in the intensive care unit of the hospital.  If you have a vascular access, such as a central line, to provide short-term or long-term access to your veins.  If you have a catheter to drain urine from your bladder.  If you are on a ventilator. A ventilator is a machine that helps you breathe by moving air in and out of your lungs.  After surgery. What are the risks? Risks of using CHG include:  A skin reaction.  Hearing loss, if CHG gets in your ears.  Eye injury, if CHG gets in your eyes and is not rinsed out.  The CHG product catching fire. Make sure that you avoid smoking and flames after applying CHG to your skin. Do not use CHG:  If you have a chlorhexidine allergy or have previously reacted to chlorhexidine.  On babies younger than 8 months of age. How to use CHG solution  Use  CHG only as told by your health care provider, and follow the instructions on the label.  Use the full amount of CHG as directed. Usually, this is one bottle. During a shower Follow these steps when using CHG solution during a shower (unless your health care provider gives you different instructions): 1. Start the shower. 2. Use your normal soap and shampoo to wash your face and hair. 3. Turn off the shower or move out of the shower stream. 4. Pour the CHG onto a clean washcloth. Do not use any type of brush or rough-edged sponge. 5. Starting at your neck, lather your body down to your toes. Make sure you follow these instructions: ? If you will be having surgery, pay special attention to the part of your body where you will be having surgery. Scrub this area for at least 1 minute. ? Do not use CHG on your head or face. If the solution gets into your ears or eyes, rinse them well with water. ? Avoid your genital area. ? Avoid any areas of skin that have broken skin, cuts, or scrapes. ? Scrub your back and under your arms. Make sure to wash skin folds. 6. Let the lather sit on your skin for 1-2 minutes or as long as told by your health care provider. 7. Thoroughly rinse your entire body in the shower. Make sure that all body creases and crevices are rinsed well. 8. Dry off with a clean towel. Do not put any substances on your body afterward--such as powder, lotion, or perfume--unless you are told to do so by your health care provider. Only use lotions that are recommended by the manufacturer. 9. Put on clean  clothes or pajamas. 10. If it is the night before your surgery, sleep in clean sheets.  During a sponge bath Follow these steps when using CHG solution during a sponge bath (unless your health care provider gives you different instructions): 1. Use your normal soap and shampoo to wash your face and hair. 2. Pour the CHG onto a clean washcloth. 3. Starting at your neck, lather your body  down to your toes. Make sure you follow these instructions: ? If you will be having surgery, pay special attention to the part of your body where you will be having surgery. Scrub this area for at least 1 minute. ? Do not use CHG on your head or face. If the solution gets into your ears or eyes, rinse them well with water. ? Avoid your genital area. ? Avoid any areas of skin that have broken skin, cuts, or scrapes. ? Scrub your back and under your arms. Make sure to wash skin folds. 4. Let the lather sit on your skin for 1-2 minutes or as long as told by your health care provider. 5. Using a different clean, wet washcloth, thoroughly rinse your entire body. Make sure that all body creases and crevices are rinsed well. 6. Dry off with a clean towel. Do not put any substances on your body afterward--such as powder, lotion, or perfume--unless you are told to do so by your health care provider. Only use lotions that are recommended by the manufacturer. 7. Put on clean clothes or pajamas. 8. If it is the night before your surgery, sleep in clean sheets. How to use CHG prepackaged cloths  Only use CHG cloths as told by your health care provider, and follow the instructions on the label.  Use the CHG cloth on clean, dry skin.  Do not use the CHG cloth on your head or face unless your health care provider tells you to.  When washing with the CHG cloth: ? Avoid your genital area. ? Avoid any areas of skin that have broken skin, cuts, or scrapes. Before surgery Follow these steps when using a CHG cloth to clean before surgery (unless your health care provider gives you different instructions): 1. Using the CHG cloth, vigorously scrub the part of your body where you will be having surgery. Scrub using a back-and-forth motion for 3 minutes. The area on your body should be completely wet with CHG when you are done scrubbing. 2. Do not rinse. Discard the cloth and let the area air-dry. Do not put any  substances on the area afterward, such as powder, lotion, or perfume. 3. Put on clean clothes or pajamas. 4. If it is the night before your surgery, sleep in clean sheets.  For general bathing Follow these steps when using CHG cloths for general bathing (unless your health care provider gives you different instructions). 1. Use a separate CHG cloth for each area of your body. Make sure you wash between any folds of skin and between your fingers and toes. Wash your body in the following order, switching to a new cloth after each step: ? The front of your neck, shoulders, and chest. ? Both of your arms, under your arms, and your hands. ? Your stomach and groin area, avoiding the genitals. ? Your right leg and foot. ? Your left leg and foot. ? The back of your neck, your back, and your buttocks. 2. Do not rinse. Discard the cloth and let the area air-dry. Do not put any substances on your  body afterward--such as powder, lotion, or perfume--unless you are told to do so by your health care provider. Only use lotions that are recommended by the manufacturer. 3. Put on clean clothes or pajamas. Contact a health care provider if:  Your skin gets irritated after scrubbing.  You have questions about using your solution or cloth. Get help right away if:  Your eyes become very red or swollen.  Your eyes itch badly.  Your skin itches badly and is red or swollen.  Your hearing changes.  You have trouble seeing.  You have swelling or tingling in your mouth or throat.  You have trouble breathing.  You swallow any chlorhexidine. Summary  Chlorhexidine gluconate (CHG) is a germ-killing (antiseptic) solution that is used to clean the skin. Cleaning your skin with CHG may help to lower your risk for infection.  You may be given CHG to use for bathing. It may be in a bottle or in a prepackaged cloth to use on your skin. Carefully follow your health care provider's instructions and the  instructions on the product label.  Do not use CHG if you have a chlorhexidine allergy.  Contact your health care provider if your skin gets irritated after scrubbing. This information is not intended to replace advice given to you by your health care provider. Make sure you discuss any questions you have with your health care provider. Document Revised: 02/11/2019 Document Reviewed: 10/23/2017 Elsevier Patient Education  2020 [employer].

## 2020-06-20 ENCOUNTER — Other Ambulatory Visit (HOSPITAL_COMMUNITY)
Admission: RE | Admit: 2020-06-20 | Discharge: 2020-06-20 | Disposition: A | Discharge status: Home / Self Care | Payer: Medicare Other | Source: Ambulatory Visit | Attending: Podiatry | Admitting: Podiatry

## 2020-06-20 ENCOUNTER — Other Ambulatory Visit: Payer: Self-pay

## 2020-06-20 ENCOUNTER — Encounter (HOSPITAL_COMMUNITY): Payer: Self-pay

## 2020-06-20 ENCOUNTER — Ambulatory Visit (HOSPITAL_COMMUNITY)
Admission: RE | Admit: 2020-06-20 | Discharge: 2020-06-20 | Disposition: A | Discharge status: Home / Self Care | Payer: Medicare Other | Source: Ambulatory Visit | Attending: Podiatry | Admitting: Podiatry

## 2020-06-20 ENCOUNTER — Other Ambulatory Visit (HOSPITAL_COMMUNITY): Payer: Self-pay | Admitting: Podiatry

## 2020-06-20 ENCOUNTER — Encounter (HOSPITAL_COMMUNITY)
Admission: RE | Admit: 2020-06-20 | Discharge: 2020-06-20 | Disposition: A | Discharge status: Home / Self Care | Payer: Medicare Other | Source: Ambulatory Visit | Attending: Podiatry | Admitting: Podiatry

## 2020-06-20 DIAGNOSIS — Z20822 Contact with and (suspected) exposure to covid-19: Secondary | ICD-10-CM | POA: Diagnosis not present

## 2020-06-20 DIAGNOSIS — Z01818 Encounter for other preprocedural examination: Secondary | ICD-10-CM | POA: Diagnosis not present

## 2020-06-20 DIAGNOSIS — L0291 Cutaneous abscess, unspecified: Secondary | ICD-10-CM | POA: Insufficient documentation

## 2020-06-20 LAB — CBC WITH DIFFERENTIAL/PLATELET
Abs Immature Granulocytes: 0.02 10*3/uL (ref 0.00–0.07)
Basophils Absolute: 0 10*3/uL (ref 0.0–0.1)
Basophils Relative: 0 %
Eosinophils Absolute: 0.2 10*3/uL (ref 0.0–0.5)
Eosinophils Relative: 2 %
HCT: 35.1 % — ABNORMAL LOW (ref 39.0–52.0)
Hemoglobin: 11.2 g/dL — ABNORMAL LOW (ref 13.0–17.0)
Immature Granulocytes: 0 %
Lymphocytes Relative: 34 %
Lymphs Abs: 2.6 10*3/uL (ref 0.7–4.0)
MCH: 28.9 pg (ref 26.0–34.0)
MCHC: 31.9 g/dL (ref 30.0–36.0)
MCV: 90.5 fL (ref 80.0–100.0)
Monocytes Absolute: 0.5 10*3/uL (ref 0.1–1.0)
Monocytes Relative: 7 %
Neutro Abs: 4.4 10*3/uL (ref 1.7–7.7)
Neutrophils Relative %: 57 %
Platelets: 219 10*3/uL (ref 150–400)
RBC: 3.88 MIL/uL — ABNORMAL LOW (ref 4.22–5.81)
RDW: 15.1 % (ref 11.5–15.5)
WBC: 7.6 10*3/uL (ref 4.0–10.5)
nRBC: 0 % (ref 0.0–0.2)

## 2020-06-21 LAB — SARS CORONAVIRUS 2 (TAT 6-24 HRS): SARS Coronavirus 2: NEGATIVE

## 2020-06-22 ENCOUNTER — Ambulatory Visit (HOSPITAL_COMMUNITY): Payer: Medicare Other | Admitting: Certified Registered"

## 2020-06-22 ENCOUNTER — Ambulatory Visit (HOSPITAL_COMMUNITY): Payer: Medicare Other

## 2020-06-22 ENCOUNTER — Encounter (HOSPITAL_COMMUNITY): Payer: Self-pay | Admitting: Podiatry

## 2020-06-22 ENCOUNTER — Encounter (HOSPITAL_COMMUNITY)
Admission: RE | Disposition: A | Discharge status: Home / Self Care | Payer: Self-pay | Source: Home / Self Care | Attending: Podiatry

## 2020-06-22 ENCOUNTER — Ambulatory Visit (HOSPITAL_COMMUNITY)
Admission: RE | Admit: 2020-06-22 | Discharge: 2020-06-22 | Disposition: A | Discharge status: Home / Self Care | Payer: Self-pay | Source: Ambulatory Visit | Attending: Podiatry | Admitting: Podiatry

## 2020-06-22 ENCOUNTER — Ambulatory Visit (HOSPITAL_COMMUNITY)
Admission: RE | Admit: 2020-06-22 | Discharge: 2020-06-22 | Disposition: A | Discharge status: Home / Self Care | Payer: Medicare Other | Attending: Podiatry | Admitting: Podiatry

## 2020-06-22 DIAGNOSIS — E1142 Type 2 diabetes mellitus with diabetic polyneuropathy: Secondary | ICD-10-CM | POA: Insufficient documentation

## 2020-06-22 DIAGNOSIS — F17210 Nicotine dependence, cigarettes, uncomplicated: Secondary | ICD-10-CM | POA: Insufficient documentation

## 2020-06-22 DIAGNOSIS — L02611 Cutaneous abscess of right foot: Secondary | ICD-10-CM | POA: Insufficient documentation

## 2020-06-22 DIAGNOSIS — X58XXXA Exposure to other specified factors, initial encounter: Secondary | ICD-10-CM | POA: Diagnosis not present

## 2020-06-22 DIAGNOSIS — I10 Essential (primary) hypertension: Secondary | ICD-10-CM | POA: Insufficient documentation

## 2020-06-22 DIAGNOSIS — I252 Old myocardial infarction: Secondary | ICD-10-CM | POA: Diagnosis not present

## 2020-06-22 DIAGNOSIS — Z9889 Other specified postprocedural states: Secondary | ICD-10-CM

## 2020-06-22 DIAGNOSIS — E11621 Type 2 diabetes mellitus with foot ulcer: Secondary | ICD-10-CM | POA: Diagnosis not present

## 2020-06-22 DIAGNOSIS — T84223A Displacement of internal fixation device of bones of foot and toes, initial encounter: Secondary | ICD-10-CM | POA: Insufficient documentation

## 2020-06-22 HISTORY — PX: INCISION AND DRAINAGE: SHX5863

## 2020-06-22 HISTORY — PX: HARDWARE REMOVAL: SHX979

## 2020-06-22 LAB — GLUCOSE, CAPILLARY
Glucose-Capillary: 147 mg/dL — ABNORMAL HIGH (ref 70–99)
Glucose-Capillary: 144 mg/dL — ABNORMAL HIGH (ref 70–99)

## 2020-06-22 SURGERY — INCISION AND DRAINAGE
Anesthesia: General | Laterality: Right

## 2020-06-22 MED ORDER — ONDANSETRON HCL 4 MG/2ML IJ SOLN: 4.0000 mg | Freq: Once | INTRAMUSCULAR | Status: DC | PRN

## 2020-06-22 MED ORDER — MIDAZOLAM HCL 5 MG/5ML IJ SOLN
INTRAMUSCULAR | Status: DC | PRN
  Administered 2020-06-22: 2 mg via INTRAVENOUS

## 2020-06-22 MED ORDER — 0.9 % SODIUM CHLORIDE (POUR BTL) OPTIME
TOPICAL | Status: DC | PRN
  Administered 2020-06-22: 1000 mL

## 2020-06-22 MED ORDER — MIDAZOLAM HCL 2 MG/2ML IJ SOLN
INTRAMUSCULAR | Status: AC
  Filled 2020-06-22: qty 2

## 2020-06-22 MED ORDER — BUPIVACAINE HCL (PF) 0.5 % IJ SOLN
INTRAMUSCULAR | Status: AC
  Filled 2020-06-22: qty 30

## 2020-06-22 MED ORDER — PROPOFOL 10 MG/ML IV BOLUS
INTRAVENOUS | Status: AC
  Filled 2020-06-22: qty 60

## 2020-06-22 MED ORDER — LACTATED RINGERS IV SOLN
INTRAVENOUS | Status: DC
  Administered 2020-06-22: 1000 mL via INTRAVENOUS

## 2020-06-22 MED ORDER — BUPIVACAINE HCL (PF) 0.5 % IJ SOLN
INTRAMUSCULAR | Status: DC | PRN
  Administered 2020-06-22: 20 mL

## 2020-06-22 MED ORDER — CEFAZOLIN SODIUM-DEXTROSE 2-4 GM/100ML-% IV SOLN
2.0000 g | Freq: Once | INTRAVENOUS | Status: AC
  Administered 2020-06-22: 2 g via INTRAVENOUS
  Filled 2020-06-22: qty 100

## 2020-06-22 MED ORDER — HYDROMORPHONE HCL 1 MG/ML IJ SOLN: 0.2500 mg | INTRAMUSCULAR | Status: DC | PRN

## 2020-06-22 MED ORDER — FENTANYL CITRATE (PF) 100 MCG/2ML IJ SOLN
INTRAMUSCULAR | Status: AC
  Filled 2020-06-22: qty 2

## 2020-06-22 MED ORDER — GLYCOPYRROLATE 0.2 MG/ML IJ SOLN
INTRAMUSCULAR | Status: DC | PRN
  Administered 2020-06-22: .2 mg via INTRAVENOUS

## 2020-06-22 MED ORDER — PROPOFOL 500 MG/50ML IV EMUL
INTRAVENOUS | Status: DC | PRN
  Administered 2020-06-22: 125 ug/kg/min via INTRAVENOUS
  Administered 2020-06-22: 50 mg via INTRAVENOUS

## 2020-06-22 MED ORDER — SODIUM CHLORIDE 0.9 % IR SOLN
Status: DC | PRN
  Administered 2020-06-22: 3000 mL

## 2020-06-22 MED ORDER — ORAL CARE MOUTH RINSE: 15.0000 mL | Freq: Once | OROMUCOSAL | Status: AC

## 2020-06-22 MED ORDER — FENTANYL CITRATE (PF) 100 MCG/2ML IJ SOLN
INTRAMUSCULAR | Status: DC | PRN
  Administered 2020-06-22 (×4): 25 ug via INTRAVENOUS

## 2020-06-22 MED ORDER — CHLORHEXIDINE GLUCONATE 0.12 % MT SOLN
15.0000 mL | Freq: Once | OROMUCOSAL | Status: AC
  Administered 2020-06-22: 15 mL via OROMUCOSAL

## 2020-06-22 SURGICAL SUPPLY — 43 items
BANDAGE ACE 4 STERILE (GAUZE/BANDAGES/DRESSINGS) ×3 IMPLANT
BANDAGE ESMARK 4X12 BL STRL LF (DISPOSABLE) ×1 IMPLANT
BANDAGE GAUZE ELAST BULKY 4 IN (GAUZE/BANDAGES/DRESSINGS) ×3 IMPLANT
BLADE SURG 15 STRL LF DISP TIS (BLADE) ×1 IMPLANT
BLADE SURG 15 STRL SS (BLADE) ×2
BNDG ELASTIC 4X5.8 VLCR NS LF (GAUZE/BANDAGES/DRESSINGS) ×3 IMPLANT
BNDG ESMARK 4X12 BLUE STRL LF (DISPOSABLE) ×3
BNDG GAUZE ELAST 4 BULKY (GAUZE/BANDAGES/DRESSINGS) ×2 IMPLANT
CAP PIN ORTHO PINK (CAP) ×3 IMPLANT
CLOSURE STERI STRIP 1/2 X4 (GAUZE/BANDAGES/DRESSINGS) ×2 IMPLANT
CLOSURE WOUND 1/2 X4 (GAUZE/BANDAGES/DRESSINGS) ×1
CLOTH BEACON ORANGE TIMEOUT ST (SAFETY) ×3 IMPLANT
CNTNR URN SCR LID CUP LEK RST (MISCELLANEOUS) ×2 IMPLANT
CONT SPEC 4OZ STRL OR WHT (MISCELLANEOUS) ×4
COVER LIGHT HANDLE STERIS (MISCELLANEOUS) ×6 IMPLANT
COVER WAND RF STERILE (DRAPES) ×3 IMPLANT
CUFF TOURN SGL QUICK 18X4 (TOURNIQUET CUFF) ×3 IMPLANT
DECANTER SPIKE VIAL GLASS SM (MISCELLANEOUS) ×3 IMPLANT
DRAPE OEC MINIVIEW 54X84 (DRAPES) ×6 IMPLANT
DRSG ADAPTIC 3X8 NADH LF (GAUZE/BANDAGES/DRESSINGS) ×3 IMPLANT
ELECT REM PT RETURN 9FT ADLT (ELECTROSURGICAL) ×3
ELECTRODE REM PT RTRN 9FT ADLT (ELECTROSURGICAL) ×1 IMPLANT
GAUZE SPONGE 4X4 12PLY STRL (GAUZE/BANDAGES/DRESSINGS) ×3 IMPLANT
GLOVE BIO SURGEON STRL SZ7.5 (GLOVE) ×3 IMPLANT
GLOVE BIOGEL PI IND STRL 7.0 (GLOVE) ×2 IMPLANT
GLOVE BIOGEL PI INDICATOR 7.0 (GLOVE) ×4
GOWN STRL REUS W/TWL LRG LVL3 (GOWN DISPOSABLE) ×6 IMPLANT
IRRIG SUCT STRYKERFLOW 2 WTIP (MISCELLANEOUS) ×3
IRRIGATION SUCT STRKRFLW 2 WTP (MISCELLANEOUS) ×1 IMPLANT
K-WIRE (Wire) ×3 IMPLANT
KIT TURNOVER KIT A (KITS) ×3 IMPLANT
MARKER SKIN DUAL TIP RULER LAB (MISCELLANEOUS) ×3 IMPLANT
NEEDLE HYPO 27GX1-1/4 (NEEDLE) ×6 IMPLANT
NS IRRIG 1000ML POUR BTL (IV SOLUTION) ×3 IMPLANT
PACK BASIC LIMB (CUSTOM PROCEDURE TRAY) ×3 IMPLANT
PAD ARMBOARD 7.5X6 YLW CONV (MISCELLANEOUS) ×3 IMPLANT
RASP SM TEAR CROSS CUT (RASP) ×3 IMPLANT
SET BASIN LINEN APH (SET/KITS/TRAYS/PACK) ×3 IMPLANT
STRIP CLOSURE SKIN 1/2X4 (GAUZE/BANDAGES/DRESSINGS) ×2 IMPLANT
SUT ETHILON 3 0 FSL (SUTURE) ×3 IMPLANT
SUT VIC AB 4-0 PS2 27 (SUTURE) ×3 IMPLANT
SWAB CULTURE LIQ STUART DBL (MISCELLANEOUS) IMPLANT
SYR CONTROL 10ML LL (SYRINGE) ×6 IMPLANT

## 2020-06-22 NOTE — Discharge Instructions (Signed)
These instructions will give you an idea of what to expect after surgery and how to manage issues that may arise before your first post op office visit.  Pain Management Pain is best managed by "staying ahead" of it. If pain gets out of control, it is difficult to get it back under control. Local anesthesia that lasts 6-8 hours is used to numb the foot and decrease pain.  For the best pain control, take the pain medication every 4 hours for the first 2 days post op. On the third day pain medication can be taken as needed.   Post Op Nausea Nausea is common after surgery, so it is managed proactively.  If prescribed, use the prescribed nausea medication regularly for the first 2 days post op.  Bandages Do not worry if there is blood on the bandage. What looks like a lot of blood on the bandage is actually a small amount. Blood on the dressing spreads out as it is absorbed by the gauze, the same way a drop of water spreads out on a paper towel.  If the bandages feel wet or dry, stiff and uncomfortable, call the office during office hours and we will schedule a time for you to have the bandage changed.  Unless you are specifically told otherwise, we will do the first bandage change in the office.  Keep your bandage dry. If the bandage becomes wet or soiled, notify the office and we will schedule a time to change the bandage.  Activity It is best to spend most of the first 2 days after surgery lying down with the foot elevated above the level of your heart. You may put weight on your heel while wearing the CAM Walker (black boot). You may only get up to go to the restroom.  Driving Do not drive until you are able to respond in an emergency (i.e. slam on the brakes). This usually occurs after the bone has healed - 6 to 8 weeks.  Call the Office If you have a fever over 101F.  If you have increasing pain after the initial post op pain has settled down.  If you have increasing redness, swelling,  or drainage.  If you have any questions or concerns.       General Anesthesia, Adult, Care After This sheet gives you information about how to care for yourself after your procedure. Your health care provider may also give you more specific instructions. If you have problems or questions, contact your health care provider. What can I expect after the procedure? After the procedure, the following side effects are common:  Pain or discomfort at the IV site.  Nausea.  Vomiting.  Sore throat.  Trouble concentrating.  Feeling cold or chills.  Weak or tired.  Sleepiness and fatigue.  Soreness and body aches. These side effects can affect parts of the body that were not involved in surgery. Follow these instructions at home:  For at least 24 hours after the procedure:  Have a responsible adult stay with you. It is important to have someone help care for you until you are awake and alert.  Rest as needed.  Do not: ? Participate in activities in which you could fall or become injured. ? Drive. ? Use heavy machinery. ? Drink alcohol. ? Take sleeping pills or medicines that cause drowsiness. ? Make important decisions or sign legal documents. ? Take care of children on your own. Eating and drinking  Follow any instructions from your health care   provider about eating or drinking restrictions.  When you feel hungry, start by eating small amounts of foods that are soft and easy to digest (bland), such as toast. Gradually return to your regular diet.  Drink enough fluid to keep your urine pale yellow.  If you vomit, rehydrate by drinking water, juice, or clear broth. General instructions  If you have sleep apnea, surgery and certain medicines can increase your risk for breathing problems. Follow instructions from your health care provider about wearing your sleep device: ? Anytime you are sleeping, including during daytime naps. ? While taking prescription pain medicines,  sleeping medicines, or medicines that make you drowsy.  Return to your normal activities as told by your health care provider. Ask your health care provider what activities are safe for you.  Take over-the-counter and prescription medicines only as told by your health care provider.  If you smoke, do not smoke without supervision.  Keep all follow-up visits as told by your health care provider. This is important. Contact a health care provider if:  You have nausea or vomiting that does not get better with medicine.  You cannot eat or drink without vomiting.  You have pain that does not get better with medicine.  You are unable to pass urine.  You develop a skin rash.  You have a fever.  You have redness around your IV site that gets worse. Get help right away if:  You have difficulty breathing.  You have chest pain.  You have blood in your urine or stool, or you vomit blood. Summary  After the procedure, it is common to have a sore throat or nausea. It is also common to feel tired.  Have a responsible adult stay with you for the first 24 hours after general anesthesia. It is important to have someone help care for you until you are awake and alert.  When you feel hungry, start by eating small amounts of foods that are soft and easy to digest (bland), such as toast. Gradually return to your regular diet.  Drink enough fluid to keep your urine pale yellow.  Return to your normal activities as told by your health care provider. Ask your health care provider what activities are safe for you. This information is not intended to replace advice given to you by your health care provider. Make sure you discuss any questions you have with your health care provider. Document Revised: 11/28/2017 Document Reviewed: 07/11/2017 Elsevier Patient Education  2020 [employer] Inc.  

## 2020-06-22 NOTE — Transfer of Care (Signed)
Immediate Anesthesia Transfer of Care Note  Patient: Richard Stout  Procedure(s) Performed: INCISION AND DRAINAGE RIGHT FOOT REMOVAL OF K-WIRE (Right ) HARDWARE REMOVAL (Right )  Patient Location: PACU  Anesthesia Type:MAC  Level of Consciousness: awake, alert , oriented and patient cooperative  Airway & Oxygen Therapy: Patient Spontanous Breathing and Patient connected to nasal cannula oxygen  Post-op Assessment: Report given to RN, Post -op Vital signs reviewed and stable and Patient moving all extremities  Post vital signs: Reviewed and stable  Last Vitals:  Vitals Value Taken Time  BP 127/80 06/22/20 1017  Temp    Pulse 58 06/22/20 1020  Resp 17 06/22/20 1020  SpO2 96 % 06/22/20 1020  Vitals shown include unvalidated device data.  Last Pain:  Vitals:   06/22/20 0810  TempSrc: Oral  PainSc: 5       Patients Stated Pain Goal: 8 (78/67/54 [id number])  Complications: No complications documented.

## 2020-06-22 NOTE — Op Note (Signed)
OPERATIVE NOTE  DATE OF PROCEDURE 06/22/2020  SURGEON Dallas Schimke, DPM  ASSISTANT SURGEON None  OR STAFF Circulator: Jacqualine Mau, RN Scrub Person: Tillman Sers Circulator Assistant: Lennox Pippins, RN   PREOPERATIVE DIAGNOSIS 1.  Abscess, right foot 2.  Wound, right foot 3.  Hardware complication wound infection, right foot 4.  Displaced distal first metatarsal osteotomy, right foot 5.  Diabetes mellitus with peripheral neuropathy  POSTOPERATIVE DIAGNOSIS 1.  Wound, right foot 2.  Hardware complication wound infection, right foot 3.  Displaced distal first metatarsal osteotomy, right foot 4.  Diabetes mellitus with peripheral neuropathy  PROCEDURE 1.  Excision of wound, right foot 2.  Removal of hardware, right foot 3.  Reduction and fixation of displaced osteotomy, right foot  ANESTHESIA Monitor Anesthesia Care   HEMOSTASIS Pneumatic ankle tourniquet set at 250 mmHg  ESTIMATED BLOOD LOSS Minimal (<5 cc)  MATERIALS USED 0.062 inch K wire  INJECTABLES 0.5% Marcaine plain  PATHOLOGY 1.  Soft tissue for anaerobic and aerobic culture and sensitivity 2.  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  Radiographs revealed medial displacement of the distal fragment.  No additional surgery was recommended because the ulceration healed.  However, he later developed a focal area of fluctuance overlying the buried K wire.  The fluctuance was evacuated in the office.  The patient was scheduled for incision and drainage with removal of hardware.  PROCEDURE IN DETAIL: The patient was brought to the operating room and placed on the operative table in the supine position.  A pneumatic ankle tourniquet was placed about the patient's  right ankle.  A timeout was performed.  The foot was anesthetized using 0.5% Marcaine plain.  The foot was scrubbed prepped and draped in the usual sterile manner.  A second timeout was performed.  The limb was then elevated exsanguinated the pneumatic ankle tourniquet inflated to 250 mmHg.  Attention was directed to the dorsal medial aspect of the right foot where a full-thickness wound was encountered.  Using a #15 blade, 2 semielliptical incisions were made encompassing the wound in its entirety. It was removed and passed from the operative field.  The subcutaneous tissue was debrided of fibrous tissue using a #15 blade.  Soft tissue was sent to microbiology for aerobic and anaerobic culture and sensitivity.  Dissection was continued deep down to the level of the buried K wire.  No purulence was encountered.  The K wire was removed without difficulty.  Dissection was extended distally to allow for adequate exposure of the distal first metatarsal.  All areas of the first metatarsal were firm and free of erosive changes to suggest osteomyelitis.  A sample of bone was removed and sent to microbiology for aerobic and anaerobic culture and sensitivity.  The distal first metatarsal osteotomy was exposed.  The osteotomy was not healed.  Fibrous tissue was encountered within the osteotomy.  The proximal distal ends of the osteotomy were prepared by debriding the fibrous tissue using a #15 blade and bone rongeur.  The distal fragment was reduced and shifted into a more corrected lateral position.  The osteotomy was fixated using a 0.062 inch Kirschner wire percutaneously.  Position of the wire was confirmed with fluoroscopy.  All bone edges were smoothed with a power rasp.  The surgical wound was irrigated  with 3 L of normal saline using pulse lavage.  The capsular and subcutaneous structures were reapproximated using 3-0 Vicryl.  The skin was reapproximated using 4-0 nylon.  A sterile compressive dressing was applied  to the right foot.  The pneumatic ankle tourniquet was deflated and a prompt hyperemic response was noted to all digits of the right foot.  The patient tolerated the procedure and anesthesia well.  He was transferred from the operating room to the postanesthesia care unit with vital signs stable and vascular status intact to all digits of the right foot.

## 2020-06-22 NOTE — Anesthesia Postprocedure Evaluation (Signed)
Anesthesia Post Note  Patient: Richard Stout  Procedure(s) Performed: INCISION AND DRAINAGE RIGHT FOOT REMOVAL OF K-WIRE (Right ) HARDWARE REMOVAL (Right )  Patient location during evaluation: PACU Anesthesia Type: General Level of consciousness: awake, oriented, awake and alert and patient cooperative Pain management: pain level controlled Vital Signs Assessment: post-procedure vital signs reviewed and stable Respiratory status: spontaneous breathing, respiratory function stable, nonlabored ventilation and patient connected to nasal cannula oxygen Cardiovascular status: blood pressure returned to baseline and stable Postop Assessment: no headache and no backache Anesthetic complications: no   No complications documented.   Last Vitals:  Vitals:   06/22/20 0810  BP: (!) 170/92  Pulse: (!) 53  Resp: 17  Temp: 36.6 C  SpO2: 99%    Last Pain:  Vitals:   06/22/20 0810  TempSrc: Oral  PainSc: 5                  Brynda Peon

## 2020-06-22 NOTE — Brief Op Note (Signed)
BRIEF OPERATIVE NOTE  DATE OF PROCEDURE 06/22/2020  SURGEON Dallas Schimke, DPM  ASSISTANT SURGEON None  OR STAFF Circulator: Jacqualine Mau, RN Scrub Person: Tillman Sers Circulator Assistant: Lennox Pippins, RN   PREOPERATIVE DIAGNOSIS 1.  Abscess, right foot 2.  Wound, right foot 3.  Hardware complication wound infection, right foot 4.  Displaced distal first metatarsal osteotomy, right foot 5.  Diabetes mellitus with peripheral neuropathy  POSTOPERATIVE DIAGNOSIS 1.  Wound, right foot 2.  Hardware complication wound infection, right foot 3.  Displaced distal first metatarsal osteotomy, right foot 4.  Diabetes mellitus with peripheral neuropathy  PROCEDURE 1.  Excision of wound, right foot 2.  Removal of hardware, right foot 3.  Reduction and fixation of displaced osteotomy, right foot  ANESTHESIA Monitor Anesthesia Care   HEMOSTASIS Pneumatic ankle tourniquet set at 250 mmHg  ESTIMATED BLOOD LOSS Minimal (<5 cc)  MATERIALS USED 0.062 inch K wire  INJECTABLES 0.5% Marcaine plain  PATHOLOGY 1.  Soft tissue for anaerobic and aerobic culture and sensitivity 2.  Bone from first metatarsal for culture  COMPLICATIONS None

## 2020-06-22 NOTE — Anesthesia Preprocedure Evaluation (Signed)
Anesthesia Evaluation  Patient identified by MRN, date of birth, ID band Patient awake    Reviewed: Allergy & Precautions, H&P , NPO status , Patient's Chart, lab work & pertinent test results, reviewed documented beta blocker date and time   Airway Mallampati: II  TM Distance: >3 FB Neck ROM: full    Dental no notable dental hx.    Pulmonary neg pulmonary ROS, Current Smoker and Patient abstained from smoking.,    Pulmonary exam normal breath sounds clear to auscultation       Cardiovascular Exercise Tolerance: Good hypertension, + Past MI   Rhythm:regular Rate:Normal     Neuro/Psych negative neurological ROS  negative psych ROS   GI/Hepatic negative GI ROS, Neg liver ROS,   Endo/Other  negative endocrine ROSdiabetes  Renal/GU negative Renal ROS  negative genitourinary   Musculoskeletal   Abdominal   Peds  Hematology negative hematology ROS (+)   Anesthesia Other Findings   Reproductive/Obstetrics negative OB ROS                             Anesthesia Physical Anesthesia Plan  ASA: III  Anesthesia Plan: General   Post-op Pain Management:    Induction:   PONV Risk Score and Plan: Propofol infusion  Airway Management Planned:   Additional Equipment:   Intra-op Plan:   Post-operative Plan:   Informed Consent: I have reviewed the patients History and Physical, chart, labs and discussed the procedure including the risks, benefits and alternatives for the proposed anesthesia with the patient or authorized representative who has indicated his/her understanding and acceptance.     Dental Advisory Given  Plan Discussed with: CRNA  Anesthesia Plan Comments:         Anesthesia Quick Evaluation

## 2020-06-22 NOTE — H&P (Signed)
HISTORY AND PHYSICAL INTERVAL NOTE:  06/22/2020  8:57 AM  Kimber Relic  has presented today for surgery, with the diagnosis of ABSCESS RIGHT FOOT, DIABETES  MELLITUS WITH PERIPHERAL NEUROPATHY.  The various methods of treatment have been discussed with the patient.  No guarantees were given.  After consideration of risks, benefits and other options for treatment, the patient has consented to surgery.  I have reviewed the patients' chart and labs.    Patient Vitals for the past 24 hrs:  BP Temp Temp src Pulse Resp SpO2  06/22/20 0810 (!) 170/92 97.8 F (36.6 C) Oral (!) 53 17 99 %    A history and physical examination was performed in my office.  The patient was reexamined.  There have been no changes to this history and physical examination.  Dallas Schimke, DPM

## 2020-06-23 ENCOUNTER — Encounter (HOSPITAL_COMMUNITY): Payer: Self-pay | Admitting: Podiatry

## 2020-06-27 LAB — AEROBIC/ANAEROBIC CULTURE W GRAM STAIN (SURGICAL/DEEP WOUND)
Culture: NO GROWTH
Culture: NO GROWTH

## 2021-05-01 IMAGING — DX DG FOOT COMPLETE 3+V*R*
3 series · 3 of 3 positions shown · non-contrast
Comparison: 06/20/2020

CLINICAL DATA: Postoperative imaging for right foot surgery

EXAM:
RIGHT FOOT COMPLETE - 3+ VIEW

[foot ap]
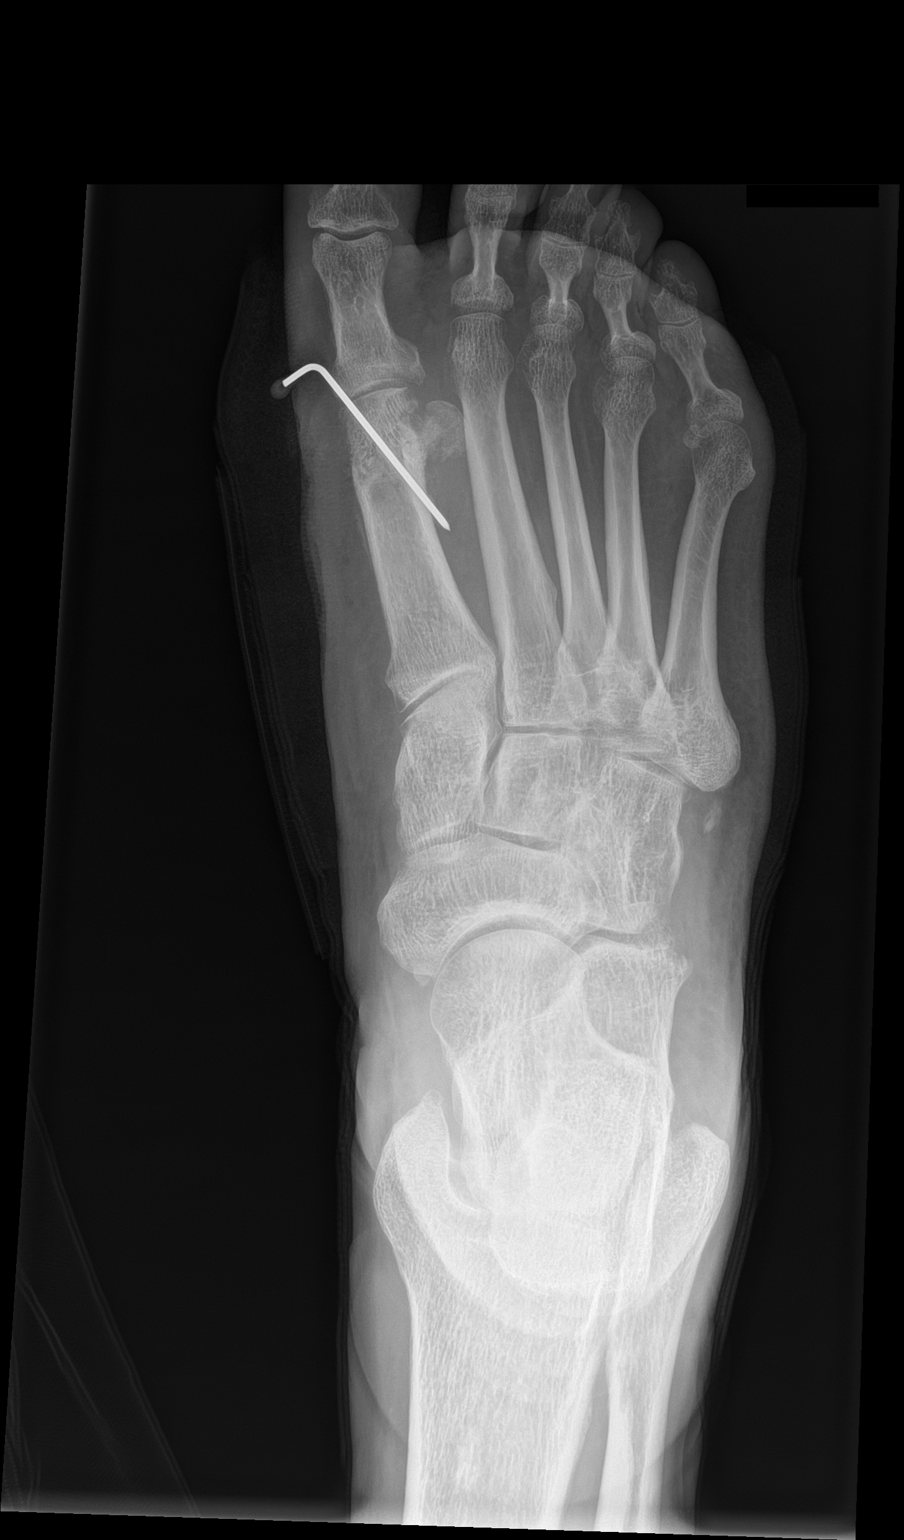

[foot obl]
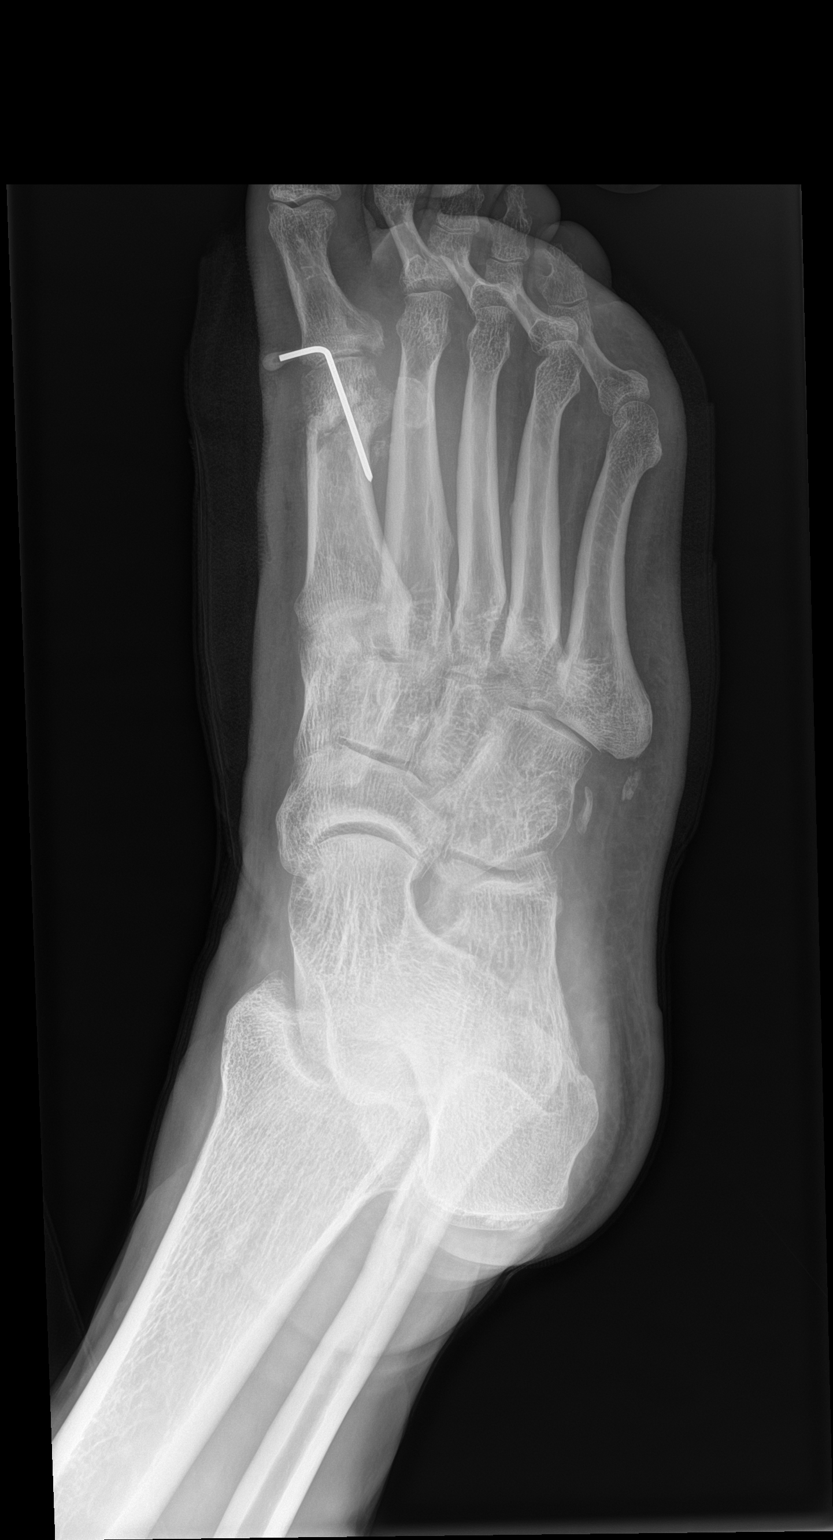

[foot lat]
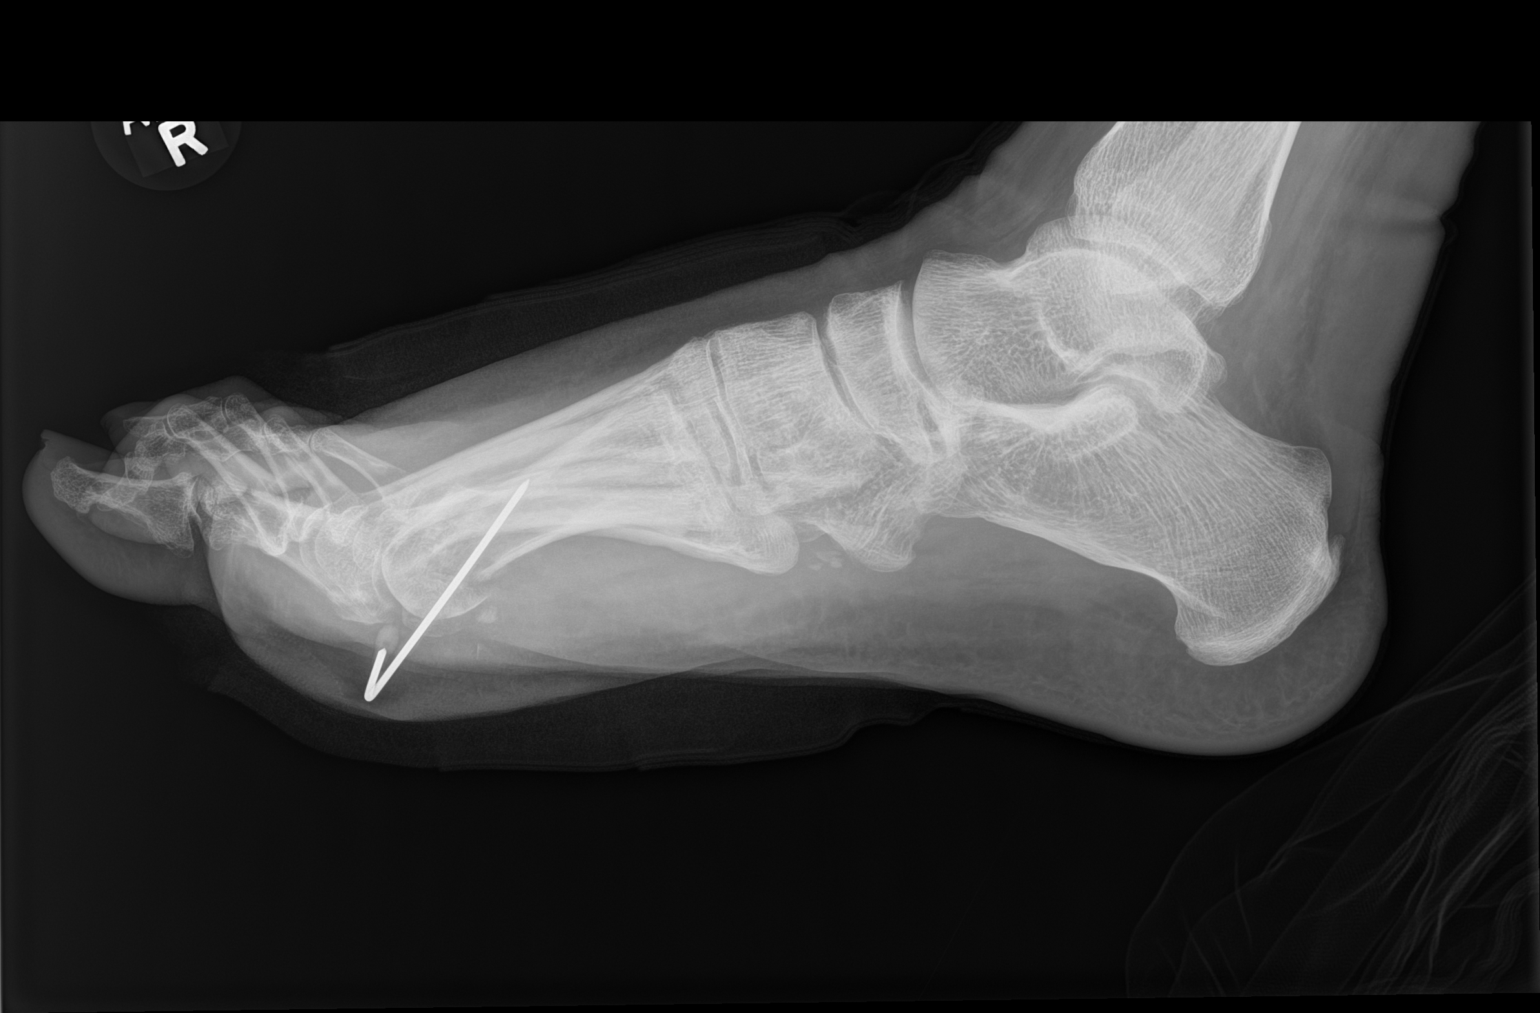

[3 of 3 positions shown; findings below may reference images not displayed]

FINDINGS: Interval postoperative changes the right foot with reduction and
fixation of displaced osteotomy of the first metatarsal head with
percutaneous K-wire fixation. Osseous structures are in good
alignment. No new periprosthetic fracture. No dislocation. Expected
postoperative changes to the overlying soft tissues.
IMPRESSION: Satisfactory postoperative appearance of the right foot status post
reduction and fixation of the first metatarsal head osteotomy.
